# Patient Record
Sex: Male | Born: 1990 | Race: White | Hispanic: No | Marital: Single | Smoking: Current every day smoker
Health system: Southern US, Community
[De-identification: ages and names within clinical notes are randomized; demographics above are authoritative.]

## PROBLEM LIST (undated history)

## (undated) DIAGNOSIS — N200 Calculus of kidney: Secondary | ICD-10-CM

---
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
## 2009-08-04 ENCOUNTER — Emergency Department: Payer: Self-pay

## 2018-05-21 ENCOUNTER — Emergency Department
Admission: EM | Admit: 2018-05-21 | Discharge: 2018-05-21 | Disposition: A | Discharge status: Home / Self Care | Payer: BLUE CROSS/BLUE SHIELD | Attending: Emergency Medicine | Admitting: Emergency Medicine

## 2018-05-21 ENCOUNTER — Emergency Department: Payer: BLUE CROSS/BLUE SHIELD

## 2018-05-21 DIAGNOSIS — Y999 Unspecified external cause status: Secondary | ICD-10-CM | POA: Diagnosis not present

## 2018-05-21 DIAGNOSIS — F17200 Nicotine dependence, unspecified, uncomplicated: Secondary | ICD-10-CM | POA: Insufficient documentation

## 2018-05-21 DIAGNOSIS — S161XXA Strain of muscle, fascia and tendon at neck level, initial encounter: Secondary | ICD-10-CM | POA: Insufficient documentation

## 2018-05-21 DIAGNOSIS — S199XXA Unspecified injury of neck, initial encounter: Secondary | ICD-10-CM | POA: Diagnosis present

## 2018-05-21 DIAGNOSIS — Y939 Activity, unspecified: Secondary | ICD-10-CM | POA: Diagnosis not present

## 2018-05-21 DIAGNOSIS — Y929 Unspecified place or not applicable: Secondary | ICD-10-CM | POA: Diagnosis not present

## 2018-05-21 MED ORDER — METHOCARBAMOL 500 MG PO TABS: 500.0000 mg | ORAL_TABLET | Freq: Four times a day (QID) | ORAL | 0 refills | Status: AC

## 2018-05-21 MED ORDER — KETOROLAC TROMETHAMINE 30 MG/ML IJ SOLN
30.0000 mg | Freq: Once | INTRAMUSCULAR | Status: AC
  Administered 2018-05-21: 30 mg via INTRAMUSCULAR
  Filled 2018-05-21: qty 1

## 2018-05-21 MED ORDER — MELOXICAM 15 MG PO TABS: 15.0000 mg | ORAL_TABLET | Freq: Every day | ORAL | 0 refills | Status: AC
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
## 2018-05-25 ENCOUNTER — Encounter: Payer: Self-pay | Admitting: Emergency Medicine

## 2018-05-25 ENCOUNTER — Emergency Department
Admission: EM | Admit: 2018-05-25 | Discharge: 2018-05-25 | Disposition: A | Discharge status: Home / Self Care | Payer: BLUE CROSS/BLUE SHIELD | Attending: Emergency Medicine | Admitting: Emergency Medicine

## 2018-05-25 ENCOUNTER — Other Ambulatory Visit: Payer: Self-pay

## 2018-05-25 DIAGNOSIS — G5601 Carpal tunnel syndrome, right upper limb: Secondary | ICD-10-CM | POA: Diagnosis not present

## 2018-05-25 DIAGNOSIS — F17200 Nicotine dependence, unspecified, uncomplicated: Secondary | ICD-10-CM | POA: Diagnosis not present

## 2018-05-25 DIAGNOSIS — M7918 Myalgia, other site: Secondary | ICD-10-CM | POA: Diagnosis not present

## 2018-05-25 DIAGNOSIS — M542 Cervicalgia: Secondary | ICD-10-CM | POA: Diagnosis present

## 2018-05-25 DIAGNOSIS — Z79899 Other long term (current) drug therapy: Secondary | ICD-10-CM | POA: Diagnosis not present

## 2018-05-25 DIAGNOSIS — M5489 Other dorsalgia: Secondary | ICD-10-CM | POA: Diagnosis not present

## 2018-05-25 MED ORDER — METHYLPREDNISOLONE 4 MG PO TBPK: ORAL_TABLET | ORAL | 0 refills | Status: AC

## 2018-05-25 MED ORDER — TRAMADOL HCL 50 MG PO TABS: 50.0000 mg | ORAL_TABLET | Freq: Two times a day (BID) | ORAL | 0 refills | Status: AC | PRN
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
## 2018-05-25 NOTE — Discharge Instructions (Signed)
Continue previous medicine consisting of muscle relaxers.  Start prednisone as directed.
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
## 2021-09-07 ENCOUNTER — Emergency Department
Admission: EM | Admit: 2021-09-07 | Discharge: 2021-09-07 | Disposition: A | Discharge status: Home / Self Care | Payer: Self-pay | Attending: Emergency Medicine | Admitting: Emergency Medicine

## 2021-09-07 ENCOUNTER — Emergency Department: Payer: Self-pay

## 2021-09-07 ENCOUNTER — Encounter: Payer: Self-pay | Admitting: Emergency Medicine

## 2021-09-07 ENCOUNTER — Other Ambulatory Visit: Payer: Self-pay

## 2021-09-07 DIAGNOSIS — N200 Calculus of kidney: Secondary | ICD-10-CM | POA: Insufficient documentation

## 2021-09-07 HISTORY — DX: Calculus of kidney: N20.0

## 2021-09-07 LAB — CBC WITH DIFFERENTIAL/PLATELET
Abs Immature Granulocytes: 0.05 10*3/uL (ref 0.00–0.07)
Basophils Absolute: 0 10*3/uL (ref 0.0–0.1)
Basophils Relative: 0 %
Eosinophils Absolute: 0.1 10*3/uL (ref 0.0–0.5)
Eosinophils Relative: 1 %
HCT: 43.7 % (ref 39.0–52.0)
Hemoglobin: 15.1 g/dL (ref 13.0–17.0)
Immature Granulocytes: 1 %
Lymphocytes Relative: 22 %
Lymphs Abs: 1.9 10*3/uL (ref 0.7–4.0)
MCH: 28.6 pg (ref 26.0–34.0)
MCHC: 34.6 g/dL (ref 30.0–36.0)
MCV: 82.8 fL (ref 80.0–100.0)
Monocytes Absolute: 0.5 10*3/uL (ref 0.1–1.0)
Monocytes Relative: 6 %
Neutro Abs: 6.2 10*3/uL (ref 1.7–7.7)
Neutrophils Relative %: 70 %
Platelets: 243 10*3/uL (ref 150–400)
RBC: 5.28 MIL/uL (ref 4.22–5.81)
RDW: 12 % (ref 11.5–15.5)
WBC: 8.8 10*3/uL (ref 4.0–10.5)
nRBC: 0 % (ref 0.0–0.2)

## 2021-09-07 LAB — URINALYSIS, COMPLETE (UACMP) WITH MICROSCOPIC
Bacteria, UA: NONE SEEN
Bilirubin Urine: NEGATIVE
Glucose, UA: NEGATIVE mg/dL
Leukocytes,Ua: NEGATIVE
Nitrite: NEGATIVE
Protein, ur: 100 mg/dL — AB
RBC / HPF: >50 RBC/hpf (ref 0–5)
Specific Gravity, Urine: 1.025 (ref 1.005–1.030)
pH: 5.5 (ref 5.0–8.0)

## 2021-09-07 LAB — BASIC METABOLIC PANEL
Anion gap: 7 (ref 5–15)
BUN: 14 mg/dL (ref 6–20)
CO2: 26 mmol/L (ref 22–32)
Calcium: 9.4 mg/dL (ref 8.9–10.3)
Chloride: 106 mmol/L (ref 98–111)
Creatinine, Ser: 1.07 mg/dL (ref 0.61–1.24)
GFR, Estimated: >60 mL/min (ref >60)
Glucose, Bld: 107 mg/dL — ABNORMAL HIGH (ref 70–99)
Potassium: 3.9 mmol/L (ref 3.5–5.1)
Sodium: 139 mmol/L (ref 135–145)

## 2021-09-07 MED ORDER — KETOROLAC TROMETHAMINE 30 MG/ML IJ SOLN
15.0000 mg | Freq: Once | INTRAMUSCULAR | Status: DC
  Filled 2021-09-07: qty 1

## 2021-09-07 MED ORDER — KETOROLAC TROMETHAMINE 30 MG/ML IJ SOLN
15.0000 mg | Freq: Once | INTRAMUSCULAR | Status: AC
  Administered 2021-09-07: 15 mg via INTRAVENOUS
  Filled 2021-09-07: qty 1

## 2021-09-07 MED ORDER — ONDANSETRON HCL 4 MG/2ML IJ SOLN
4.0000 mg | Freq: Once | INTRAMUSCULAR | Status: AC
  Administered 2021-09-07: 4 mg via INTRAVENOUS
  Filled 2021-09-07: qty 2

## 2021-09-07 MED ORDER — HYDROMORPHONE HCL 1 MG/ML IJ SOLN
1.0000 mg | Freq: Once | INTRAMUSCULAR | Status: AC
  Administered 2021-09-07: 1 mg via INTRAVENOUS
  Filled 2021-09-07: qty 1

## 2021-09-07 MED ORDER — TAMSULOSIN HCL 0.4 MG PO CAPS: 0.4000 mg | ORAL_CAPSULE | Freq: Every day | ORAL | 0 refills | Status: AC

## 2021-09-07 MED ORDER — KETOROLAC TROMETHAMINE 30 MG/ML IJ SOLN
15.0000 mg | Freq: Once | INTRAMUSCULAR | Status: AC
  Administered 2021-09-07: 15 mg via INTRAVENOUS

## 2021-09-07 MED ORDER — OXYCODONE-ACETAMINOPHEN 5-325 MG PO TABS
1.0000 | ORAL_TABLET | Freq: Once | ORAL | Status: AC
  Administered 2021-09-07: 1 via ORAL
  Filled 2021-09-07: qty 1

## 2021-09-07 MED ORDER — IBUPROFEN 400 MG PO TABS: 400.0000 mg | ORAL_TABLET | Freq: Four times a day (QID) | ORAL | 0 refills | Status: AC | PRN

## 2021-09-07 MED ORDER — ONDANSETRON 4 MG PO TBDP
4.0000 mg | ORAL_TABLET | Freq: Once | ORAL | Status: DC
  Filled 2021-09-07: qty 1

## 2021-09-07 NOTE — Discharge Instructions (Signed)
You have a left-sided 3 mm kidney stone.  You can take the Flomax to help with passing the stone and I recommend 400 mg of ibuprofen every 6 hours for pain.  Reasons to return to the emergency department include fevers, uncontrolled pain or inability to eat and drink due to severe nausea and vomiting.  Please follow-up with urology in about 1 week.

## 2021-09-07 NOTE — ED Notes (Signed)
Pt c/o of left sided flank pain. Pt states he has a hx of kidney stones. Had a stent placed about 2 years ago and last had a kidney stone about a couple months ago.

## 2021-09-07 NOTE — ED Triage Notes (Signed)
Patient ambulatory to triage, appears uncomfortable, restless; pt reports since midnight having pain to left side, hx kidney stone

## 2021-09-07 NOTE — ED Notes (Signed)
Pt discharge information reviewed. Pt understands need for follow up care and when to return if symptoms worsen. All questions answered. Pt is alert and oriented with even and regular respirations. Pt is seen ambulating out of department with string steady gait.   

## 2021-09-07 NOTE — ED Provider Notes (Signed)
[facility] Provider Note    Event Date/Time   First MD Initiated Contact with Patient 09/07/21 [phone]     (approximate)   History   Flank Pain   HPI  RALPHAEL SOUTHGATE is a 31 y.o. male past medical history of nephrolithiasis presents with left-sided flank pain.  Symptoms started acutely tonight around 12 AM.  Pain is located in the left back radiating around to the left groin.  He has associated dysuria.  Pain is colicky.  No fevers has had nausea but no vomiting.  Feels similar to prior kidney stones.  Patient has had a stent placed on the left side about 2 years ago.    Past Medical History:  Diagnosis Date   Kidney stone     There are no problems to display for this patient.    Physical Exam  Triage Vital Signs: ED Triage Vitals  Enc Vitals Group     BP 09/07/21 0048 (!) 159/99     Pulse Rate 09/07/21 0048 94     Resp 09/07/21 0048 20     Temp 09/07/21 0048 98.8 F (37.1 C)     Temp Source 09/07/21 0048 Oral     SpO2 09/07/21 0048 96 %     Weight 09/07/21 0046 245 lb (111.1 kg)     Height 09/07/21 0046 6\' 3"  (1.905 m)     Head Circumference --      Peak Flow --      Pain Score 09/07/21 0049 10     Pain Loc --      Pain Edu? --      Excl. in GC? --     Most recent vital signs: Vitals:   09/07/21 0048  BP: (!) 159/99  Pulse: 94  Resp: 20  Temp: 98.8 F (37.1 C)  SpO2: 96%     General: Awake, is uncomfortable CV:  Good peripheral perfusion.  Resp:  Normal effort.  Abd:  No distention.  Mild tenderness to palpation left lower quadrant Neuro:             Awake, Alert, Oriented x 3  Other:     ED Results / Procedures / Treatments  Labs (all labs ordered are listed, but only abnormal results are displayed) Labs Reviewed  URINALYSIS, COMPLETE (UACMP) WITH MICROSCOPIC - Abnormal; Notable for the following components:      Result Value   APPearance HAZY (*)    Hgb urine dipstick LARGE (*)    Ketones, ur TRACE (*)     Protein, ur 100 (*)    All other components within normal limits  BASIC METABOLIC PANEL - Abnormal; Notable for the following components:   Glucose, Bld 107 (*)    All other components within normal limits  CBC WITH DIFFERENTIAL/PLATELET     EKG     RADIOLOGY I reviewed the CT renal study which shows a 3 mm stone at the left UVJ  PROCEDURES:  Critical Care performed: No     MEDICATIONS ORDERED IN ED: Medications  ketorolac (TORADOL) 30 MG/ML injection 15 mg (15 mg Intravenous Given 09/07/21 0226)  ondansetron (ZOFRAN) injection 4 mg (4 mg Intravenous Given 09/07/21 0224)  oxyCODONE-acetaminophen (PERCOCET/ROXICET) 5-325 MG per tablet 1 tablet (1 tablet Oral Given 09/07/21 0256)  ketorolac (TORADOL) 30 MG/ML injection 15 mg (15 mg Intravenous Given 09/07/21 0342)  HYDROmorphone (DILAUDID) injection 1 mg (1 mg Intravenous Given 09/07/21 0343)     IMPRESSION / MDM / ASSESSMENT AND  PLAN / ED COURSE  I reviewed the triage vital signs and the nursing notes.                              Differential diagnosis includes, but is not limited to, nephrolithiasis, UTI  Patient is a 31 year old male with history of kidney stones presents with acute onset of left flank and left lower quadrant pain with associated dysuria tonight.  On exam he appears uncomfortable has difficulty getting comfortable in the bed.  No CVA tenderness there is some mild tenderness to palpation the left lower quadrant.  UA has significant RBCs, no WBCs.  I strongly suspect that he has recurrent nephrolithiasis.  We will obtain a CT renal study to assess the size of the stone.  We will treat supportively with Toradol and Zofran.  CT demonstrates a 3 mm stone in the left UVJ.  There was a delay in the patient getting the Toradol initially which I had ordered intramuscular.  Given patient's frustration with the delay ultimately an IV was placed to that it would act faster there was some difficulty getting an IV as well.   On repeat assessment patient still having some pain although somewhat improved after Toradol.  He was given dose of Percocet and attempt to hopefully transition to oral pain meds.  Patient's mom was frustrated with the delay and felt that the hour and a half that he waited before getting pain medication was inappropriate and she asked to speak with the action relations.  I apologize for this delay but made it clear that he was treated just as I would treat any patient with a presumed kidney stone, with Toradol.  We will give another dose of Toradol and a dose of IV Dilaudid and attempt to control the acute pain.  After second dose of Toradol and Dilaudid patient's pain is now gone.  There is no evidence of infection on his UA.  Given pain controlled he is appropriate for discharge.  We will refill his prescription for Flomax and I recommended NSAIDs for pain control.  He was referred to urology.  Clinical Course as of 09/07/21 0421  Caleen Essex Sep 07, 2021  0251 IMPRESSION: 1. A 3 mm stone at the left ureterovesical junction with mild left hydronephrosis. 2. Punctate nonobstructing bilateral renal calculi. 3. No bowel obstruction. Normal appendix.   [KM]    Clinical Course User Index [KM] Georga Hacking, MD     FINAL CLINICAL IMPRESSION(S) / ED DIAGNOSES   Final diagnoses:  Kidney stone     Rx / DC Orders   ED Discharge Orders          Ordered    ibuprofen (ADVIL) 400 MG tablet  Every 6 hours PRN        09/07/21 0420    tamsulosin (FLOMAX) 0.4 MG CAPS capsule  Daily        09/07/21 0420             Note:  This document was prepared using Dragon voice recognition software and may include unintentional dictation errors.   Georga Hacking, MD 09/07/21 [phone]

## 2023-03-02 IMAGING — CT CT RENAL STONE PROTOCOL
2 of 4 series · 16 of 46 positions shown, 18 images · non-contrast
Comparison: None.

CLINICAL DATA: Flank pain.  Concern for kidney stone.



[Series 2: stone full standard · axial · 0.92mm/px · z∈[-859,-439]mm · 13 of 94 slices shown, 15 images]
[im 5/94  soft-tissue]
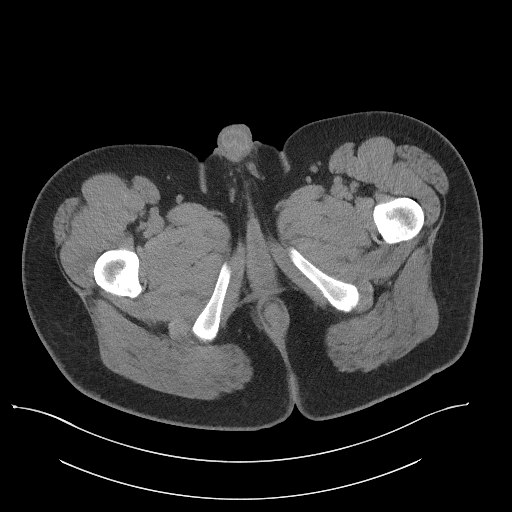
[im 5/94  bone]
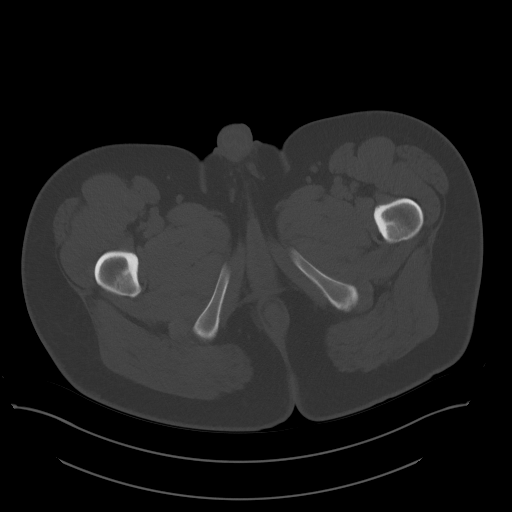
[im 13/94  soft-tissue]
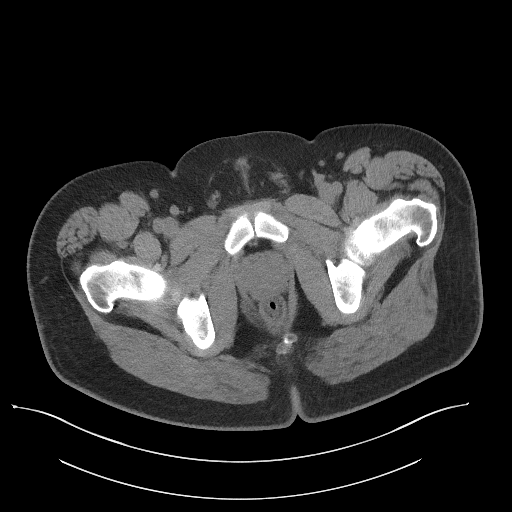
[im 21/94  soft-tissue]
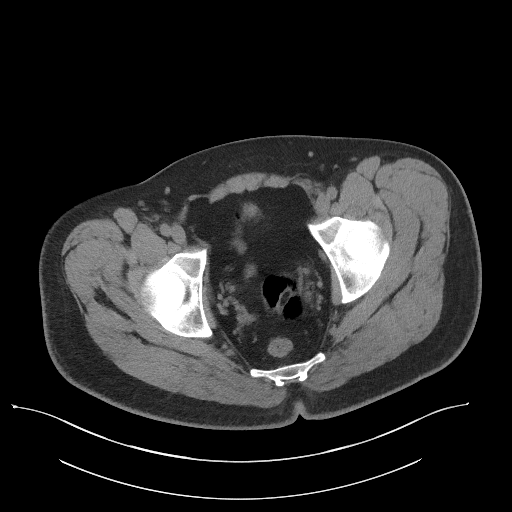
[im 25/94  soft-tissue]
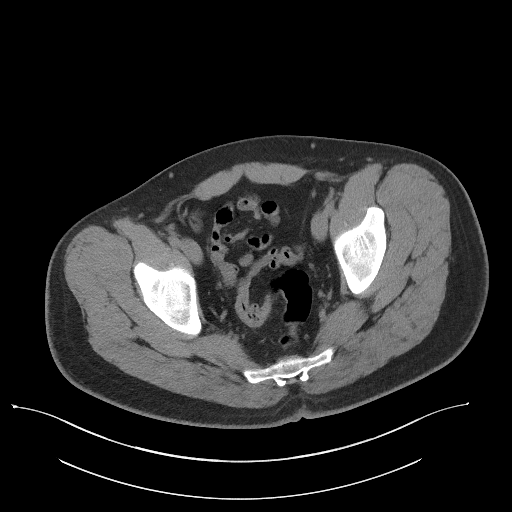
[im 33/94  soft-tissue]
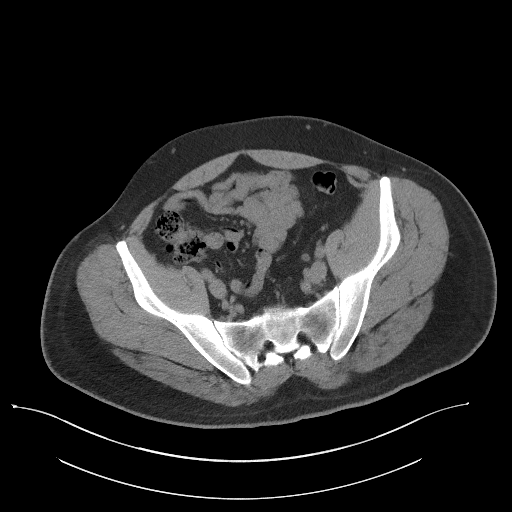
[im 41/94  soft-tissue]
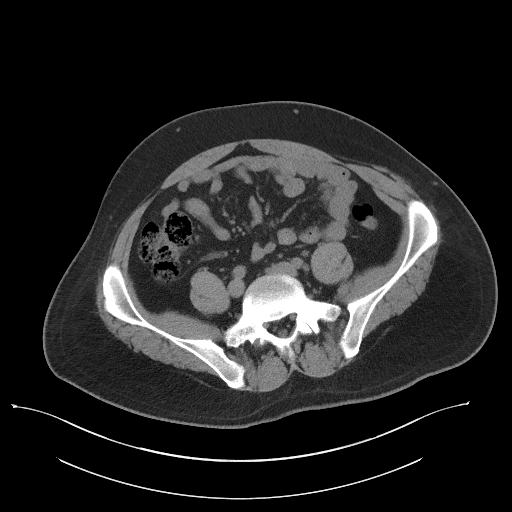
[im 49/94  soft-tissue]
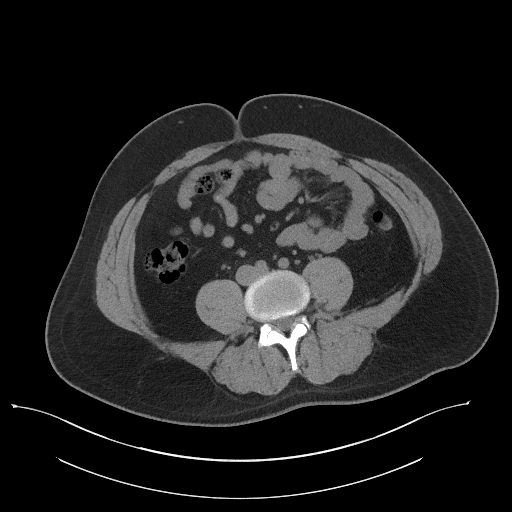
[im 53/94  soft-tissue]
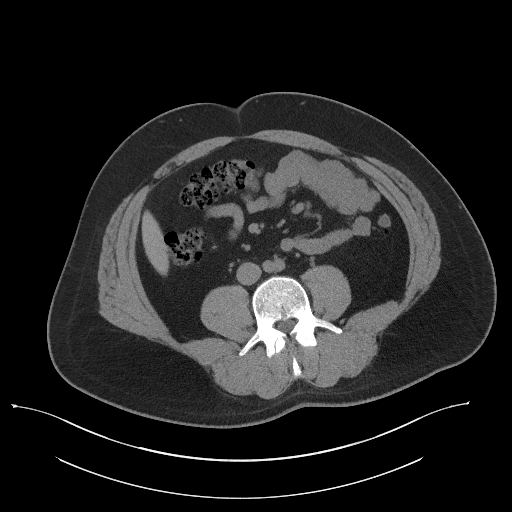
[im 61/94  soft-tissue]
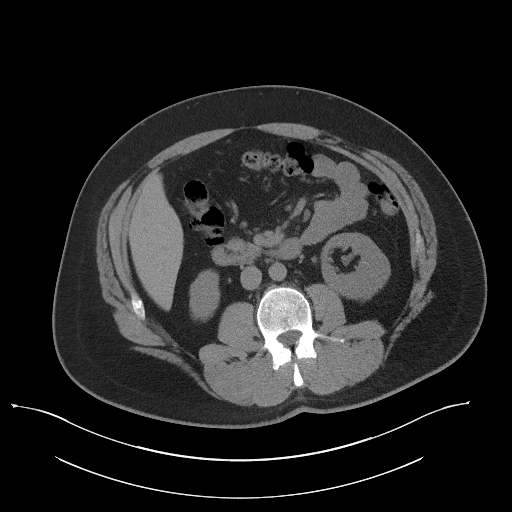
[im 61/94  bone]
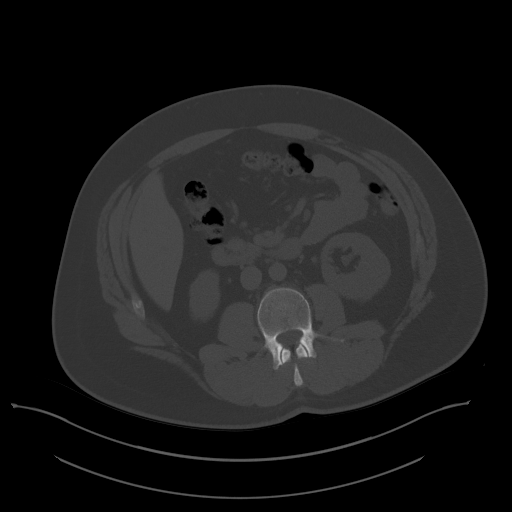
[im 69/94  soft-tissue]
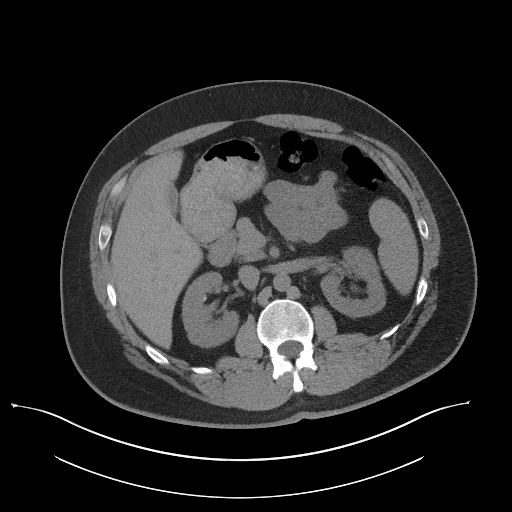
[im 73/94  soft-tissue]
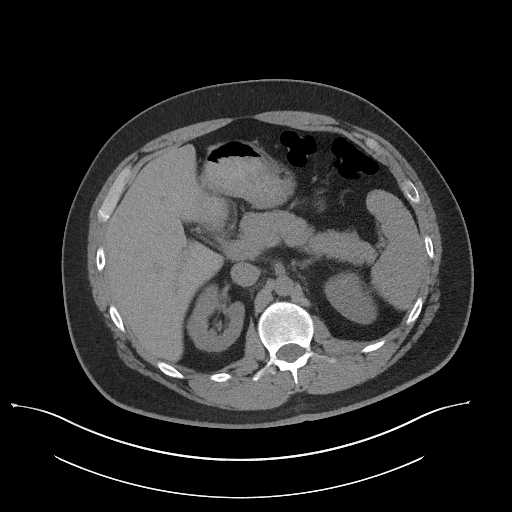
[im 81/94  soft-tissue]
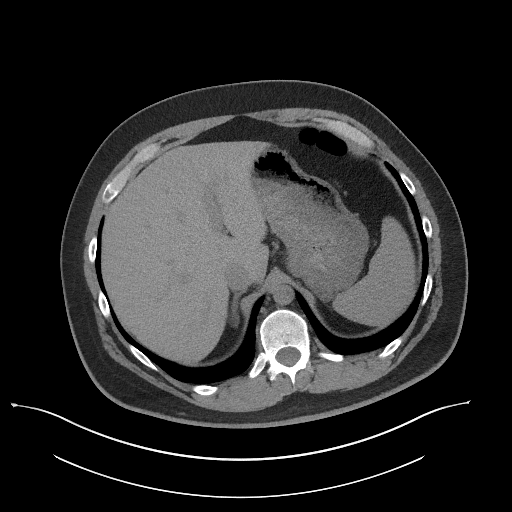
[im 89/94  soft-tissue]
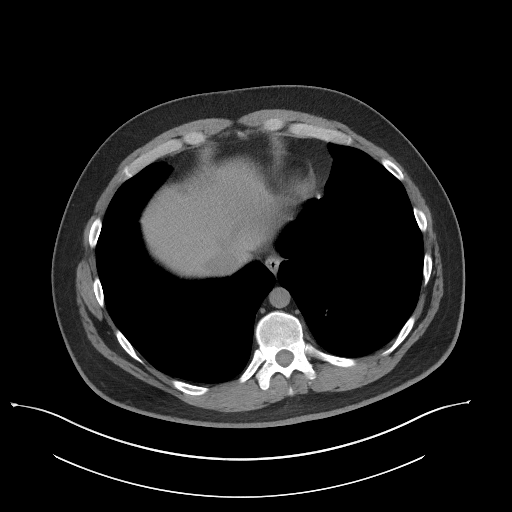

[Series 5: coronal · coronal · 0.76mm/px · 3 of 154 slices shown]
[im 52/154  soft-tissue]
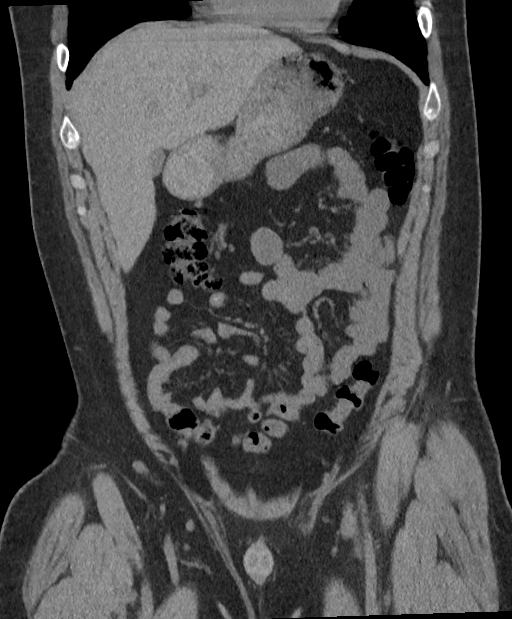
[im 69/154  soft-tissue]
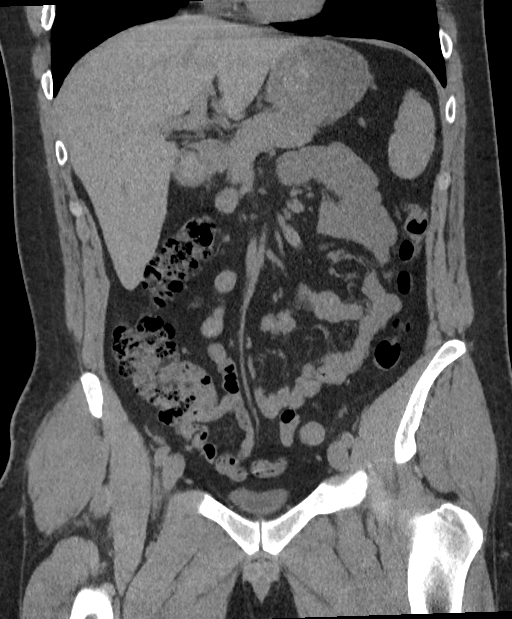
[im 86/154  soft-tissue]
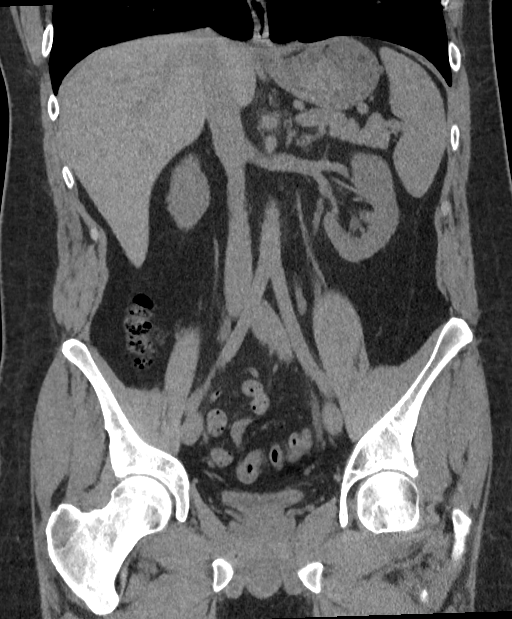

[16 of 46 positions shown; findings below may reference images not displayed]

FINDINGS: Evaluation of this exam is limited in the absence of intravenous
contrast.

Lower chest: The visualized lung bases are clear.

No intra-abdominal free air or free fluid.

Hepatobiliary: No focal liver abnormality is seen. No gallstones,
gallbladder wall thickening, or biliary dilatation.

Pancreas: Unremarkable. No pancreatic ductal dilatation or
surrounding inflammatory changes.

Spleen: Normal in size without focal abnormality.

Adrenals/Urinary Tract: The adrenal glands are unremarkable. There
is a punctate nonobstructing left renal inferior pole calculus.
Punctate nonobstructing right renal interpolar stone is also noted.
There is a 3 mm stone at the left ureterovesical junction with mild
left hydronephrosis. The right ureter is unremarkable. The urinary
bladder is collapsed.

Stomach/Bowel: There is no bowel obstruction or active inflammation.
The appendix is normal.

Vascular/Lymphatic: The abdominal aorta and IVC are grossly
unremarkable on this noncontrast CT. No portal venous gas. There is
no adenopathy.

Reproductive: The prostate and seminal vesicles are grossly
unremarkable. No pelvic mass.

Other: None

Musculoskeletal: No acute or significant osseous findings.
IMPRESSION: 1. A 3 mm stone at the left ureterovesical junction with mild left
hydronephrosis.
2. Punctate nonobstructing bilateral renal calculi.
3. No bowel obstruction. Normal appendix.
